# Patient Record
Sex: Female | Born: 1970 | Race: Black or African American | Hispanic: No | Marital: Single | Smoking: Former smoker
Health system: Southern US, Community
[De-identification: ages and names within clinical notes are randomized; demographics above are authoritative.]

## PROBLEM LIST (undated history)

## (undated) DIAGNOSIS — I10 Essential (primary) hypertension: Secondary | ICD-10-CM

## (undated) DIAGNOSIS — J302 Other seasonal allergic rhinitis: Secondary | ICD-10-CM

## (undated) DIAGNOSIS — B9689 Other specified bacterial agents as the cause of diseases classified elsewhere: Secondary | ICD-10-CM

## (undated) DIAGNOSIS — B977 Papillomavirus as the cause of diseases classified elsewhere: Secondary | ICD-10-CM

## (undated) DIAGNOSIS — N760 Acute vaginitis: Secondary | ICD-10-CM

## (undated) DIAGNOSIS — D219 Benign neoplasm of connective and other soft tissue, unspecified: Secondary | ICD-10-CM

## (undated) DIAGNOSIS — J329 Chronic sinusitis, unspecified: Secondary | ICD-10-CM

## (undated) HISTORY — PX: LAPAROSCOPY FOR ECTOPIC PREGNANCY: SUR765

## (undated) HISTORY — PX: DILATION AND CURETTAGE OF UTERUS: SHX78

## (undated) HISTORY — PX: ECTOPIC PREGNANCY SURGERY: SHX613

---

## 2001-07-08 ENCOUNTER — Other Ambulatory Visit: Admission: RE | Admit: 2001-07-08 | Discharge: 2001-07-08 | Payer: Self-pay | Admitting: Internal Medicine

## 2003-03-27 ENCOUNTER — Other Ambulatory Visit: Admission: RE | Admit: 2003-03-27 | Discharge: 2003-03-27 | Payer: Self-pay | Admitting: Family Medicine

## 2003-05-20 ENCOUNTER — Emergency Department (HOSPITAL_COMMUNITY): Admission: EM | Admit: 2003-05-20 | Discharge: 2003-05-20 | Payer: Self-pay | Admitting: Emergency Medicine

## 2005-09-26 ENCOUNTER — Emergency Department (HOSPITAL_COMMUNITY): Admission: EM | Admit: 2005-09-26 | Discharge: 2005-09-26 | Payer: Self-pay | Admitting: Family Medicine

## 2007-04-26 ENCOUNTER — Emergency Department (HOSPITAL_COMMUNITY): Admission: RE | Admit: 2007-04-26 | Discharge: 2007-04-27 | Payer: Self-pay | Admitting: General Practice

## 2009-04-24 ENCOUNTER — Encounter: Admission: RE | Admit: 2009-04-24 | Discharge: 2009-04-24 | Payer: Self-pay | Admitting: Family Medicine

## 2009-06-14 ENCOUNTER — Encounter: Admission: RE | Admit: 2009-06-14 | Discharge: 2009-06-14 | Payer: Self-pay | Admitting: Family Medicine

## 2009-06-26 ENCOUNTER — Encounter: Admission: RE | Admit: 2009-06-26 | Discharge: 2009-06-26 | Payer: Self-pay | Admitting: Family Medicine

## 2009-10-18 ENCOUNTER — Emergency Department (HOSPITAL_COMMUNITY): Admission: EM | Admit: 2009-10-18 | Discharge: 2009-10-18 | Payer: Self-pay | Admitting: Family Medicine

## 2010-05-24 ENCOUNTER — Encounter: Payer: Self-pay | Admitting: Family Medicine

## 2010-05-25 ENCOUNTER — Encounter: Payer: Self-pay | Admitting: Family Medicine

## 2010-07-20 LAB — WET PREP, GENITAL: Yeast Wet Prep HPF POC: NONE SEEN

## 2010-07-20 LAB — GC/CHLAMYDIA PROBE AMP, GENITAL: GC Probe Amp, Genital: NEGATIVE

## 2011-02-06 LAB — BASIC METABOLIC PANEL
Chloride: 107
Creatinine, Ser: 0.81
GFR calc Af Amer: >60
Glucose, Bld: 89
Potassium: 3.6

## 2011-02-06 LAB — DIFFERENTIAL
Basophils Absolute: 0.3 — ABNORMAL HIGH
Eosinophils Absolute: 0.4
Eosinophils Relative: 3
Lymphocytes Relative: 36
Monocytes Absolute: 0.6
Monocytes Relative: 6
Neutrophils Relative %: 52

## 2011-02-06 LAB — URINALYSIS, ROUTINE W REFLEX MICROSCOPIC: Nitrite: NEGATIVE

## 2011-02-06 LAB — URINE CULTURE

## 2011-02-06 LAB — CBC
Hemoglobin: 12.6
Platelets: 448 — ABNORMAL HIGH
RBC: 4.61
WBC: 11.4 — ABNORMAL HIGH

## 2011-02-06 LAB — URINE MICROSCOPIC-ADD ON

## 2011-06-05 ENCOUNTER — Emergency Department (INDEPENDENT_AMBULATORY_CARE_PROVIDER_SITE_OTHER)
Admission: EM | Admit: 2011-06-05 | Discharge: 2011-06-05 | Disposition: A | Discharge status: Home / Self Care | Payer: 59 | Source: Home / Self Care | Attending: Family Medicine | Admitting: Family Medicine

## 2011-06-05 ENCOUNTER — Encounter (HOSPITAL_COMMUNITY): Payer: Self-pay | Admitting: Emergency Medicine

## 2011-06-05 DIAGNOSIS — H109 Unspecified conjunctivitis: Secondary | ICD-10-CM

## 2011-06-05 DIAGNOSIS — N72 Inflammatory disease of cervix uteri: Secondary | ICD-10-CM

## 2011-06-05 DIAGNOSIS — J329 Chronic sinusitis, unspecified: Secondary | ICD-10-CM

## 2011-06-05 LAB — POCT URINALYSIS DIP (DEVICE)
Ketones, ur: NEGATIVE mg/dL
Nitrite: NEGATIVE
Protein, ur: NEGATIVE mg/dL
Specific Gravity, Urine: >=1.03 (ref 1.005–1.030)
Urobilinogen, UA: 1 mg/dL (ref 0.0–1.0)
pH: 5.5 (ref 5.0–8.0)

## 2011-06-05 LAB — WET PREP, GENITAL: Yeast Wet Prep HPF POC: NONE SEEN

## 2011-06-05 MED ORDER — AZITHROMYCIN 250 MG PO TABS
1000.0000 mg | ORAL_TABLET | Freq: Once | ORAL | Status: AC
  Administered 2011-06-05: 1000 mg via ORAL

## 2011-06-05 MED ORDER — CEFTRIAXONE SODIUM 250 MG IJ SOLR
INTRAMUSCULAR | Status: AC
  Filled 2011-06-05: qty 250

## 2011-06-05 MED ORDER — AMOXICILLIN 500 MG PO CAPS: 500.0000 mg | ORAL_CAPSULE | Freq: Three times a day (TID) | ORAL | Status: AC

## 2011-06-05 MED ORDER — METRONIDAZOLE 500 MG PO TABS: 500.0000 mg | ORAL_TABLET | Freq: Two times a day (BID) | ORAL | Status: AC

## 2011-06-05 MED ORDER — AZITHROMYCIN 250 MG PO TABS
1000.0000 mg | ORAL_TABLET | Freq: Every day | ORAL | Status: DC
  Administered 2011-06-05: 1000 mg via ORAL

## 2011-06-05 MED ORDER — PREDNISONE 20 MG PO TABS: ORAL_TABLET | ORAL | Status: AC

## 2011-06-05 MED ORDER — AZITHROMYCIN 250 MG PO TABS
ORAL_TABLET | ORAL | Status: AC
  Filled 2011-06-05: qty 4

## 2011-06-05 MED ORDER — CEFTRIAXONE SODIUM 250 MG IJ SOLR
250.0000 mg | Freq: Once | INTRAMUSCULAR | Status: AC
  Administered 2011-06-05: 250 mg via INTRAMUSCULAR

## 2011-06-05 NOTE — ED Provider Notes (Signed)
History     CSN: [id number]  Arrival date & time 06/05/11  1539   First MD Initiated Contact with Patient 06/05/11 [phone]      Chief Complaint  Patient presents with  . Conjunctivitis  . Vaginal Discharge    (Consider location/radiation/quality/duration/timing/severity/associated sxs/prior treatment) HPI Comments: 41 y/o female [id number] (2 ectopic and 1 miscarriage) also h/o chronic rhinosinusitis,  genital herpes, HPV and uterine fibroids. Here c/o: 1)Vaginal discharge associated with bad oddor and low adbdominal discomfort for about 2 weeks  Removed "an old tampon" that was in her vagina for several days before her period started. She had normal period started about 5 days ago now her period is gone but the vaginal discharge and discomfort persists.  2) Sinus pain pressure and nasal green discharge for 2 weeks associated in last 2 days with subjective fever, chills nausea and headaches.   Patient is a 41 y.o. female presenting with vaginal discharge.  Vaginal Discharge Associated symptoms include headaches. Pertinent negatives include no shortness of breath.    History reviewed. No pertinent past medical history.  Past Surgical History  Procedure Date  . Laparoscopy for ectopic pregnancy     No family history on file.  History  Substance Use Topics  . Smoking status: Never Smoker   . Smokeless tobacco: Not on file  . Alcohol Use: Yes    OB History    Grav Para Term Preterm Abortions TAB SAB Ect Mult Living                  Review of Systems  Constitutional: Positive for fever and chills. Negative for appetite change.  HENT: Positive for congestion, rhinorrhea, postnasal drip and sinus pressure. Negative for sore throat and facial swelling.   Respiratory: Negative for cough and shortness of breath.   Gastrointestinal: Positive for nausea. Negative for vomiting, diarrhea and constipation.  Genitourinary: Positive for vaginal discharge and pelvic pain. Negative for  dysuria, frequency, hematuria and menstrual problem.  Neurological: Positive for headaches. Negative for dizziness.    Allergies  Review of patient's allergies indicates no known allergies.  Home Medications   Current Outpatient Rx  Name Route Sig Dispense Refill  . AMOXICILLIN 500 MG PO CAPS Oral Take 1 capsule (500 mg total) by mouth 3 (three) times daily. 21 capsule 0  . METRONIDAZOLE 500 MG PO TABS Oral Take 1 tablet (500 mg total) by mouth 2 times daily at 12 noon and 4 pm. 14 tablet 0  . PREDNISONE 20 MG PO TABS  2 tabs po daily for 5 days 10 tablet no    BP 164/99  Pulse 64  Temp(Src) 99.2 F (37.3 C) (Oral)  Resp 20  SpO2 97%  LMP 06/01/2011  Physical Exam  Nursing note and vitals reviewed. Constitutional: She appears well-developed and well-nourished. No distress.  HENT:  Head: Normocephalic and atraumatic.  Right Ear: External ear normal.  Left Ear: External ear normal.  Mouth/Throat: Oropharynx is clear and moist.       Nasal Congestion with erythema and swelling of nasal turbinates, yellow rhinorrhea. Post nasal drip mild erythema of the pharyng, no exudates. No uvula deviation. No trismus. TM's normal  Eyes: EOM are normal. Pupils are equal, round, and reactive to light.       Left eye with mild swelling and erythema of the conjunctiva. No significant blepharitis. Mild white exudate.  Neck: Neck supple.  Cardiovascular: Normal heart sounds.   Pulmonary/Chest: Breath sounds normal. No respiratory distress. She has  no wheezes. She has no rales. She exhibits no tenderness.  Abdominal: Soft. She exhibits no distension. There is no tenderness. Hernia confirmed negative in the right inguinal area and confirmed negative in the left inguinal area.       Uterus fundus irregular fibromatous can be felt at level of umbilicus.   Genitourinary: Uterus is enlarged. Uterus is not tender. Cervix exhibits discharge. Cervix exhibits no motion tenderness. Right adnexum displays no  mass, no tenderness and no fullness. Left adnexum displays no mass, no tenderness and no fullness. No erythema, tenderness or bleeding around the vagina. No foreign body around the vagina. Vaginal discharge found.    Lymphadenopathy:    She has no cervical adenopathy.       Right: No inguinal adenopathy present.       Left: No inguinal adenopathy present.    ED Course  Procedures (including critical care time)  Labs Reviewed  POCT URINALYSIS DIP (DEVICE) - Abnormal; Notable for the following:    Hgb urine dipstick MODERATE (*)    All other components within normal limits  WET PREP, GENITAL  GC/CHLAMYDIA PROBE AMP, GENITAL   No results found.   1. Cervicitis   2. Sinusitis   3. Conjunctivitis       MDM  Vaginal discharge with mild pelvic discomfort and cervicitis with no pain with cervical motion, very enlarged fibromatous uterus, negative pregnancy test, treated empirically for gonorrhea and chlamydia with rocephin 250mg  IM x1 and azithromycin 1g po x1. Prescription for amoxicilline and prednisone. Lubricant eye drops and prescription for flagyl to take after finish amoxicillin if persistent vaginal discharge.         Sharin Grave, MD 06/06/11 [phone]

## 2011-06-05 NOTE — ED Notes (Signed)
PT HERE WITH LEFT EYE REDNESS AND WHITE D/C AND MIN BLURRY VISION X 2 DYS AND VAG WHITE D/C WITH ODOR,ABDOMINAL RADIATING TO LOWER BACK,FEVER,CHILLS  THAT STARTED POST MENSTRUAL PERIOD.PT STATES SHE FORGOT OLD TAMPON WAS LEFT IN HER THEN SX STARTED.LMP X 5 DYS AGO

## 2011-06-05 NOTE — ED Notes (Signed)
AZITHROMYCIN 1,000MG  GIVEN PO X 1.CORRECT ORDER PUT IN BY MD FOR ERROR

## 2011-06-08 LAB — POCT PREGNANCY, URINE: Preg Test, Ur: NEGATIVE

## 2011-09-06 ENCOUNTER — Encounter (HOSPITAL_COMMUNITY): Payer: Self-pay | Admitting: Emergency Medicine

## 2011-09-06 ENCOUNTER — Emergency Department (INDEPENDENT_AMBULATORY_CARE_PROVIDER_SITE_OTHER)
Admission: EM | Admit: 2011-09-06 | Discharge: 2011-09-06 | Disposition: A | Discharge status: Home / Self Care | Payer: 59 | Source: Home / Self Care | Attending: Emergency Medicine | Admitting: Emergency Medicine

## 2011-09-06 DIAGNOSIS — J329 Chronic sinusitis, unspecified: Secondary | ICD-10-CM

## 2011-09-06 HISTORY — DX: Papillomavirus as the cause of diseases classified elsewhere: B97.7

## 2011-09-06 HISTORY — DX: Other specified bacterial agents as the cause of diseases classified elsewhere: B96.89

## 2011-09-06 HISTORY — DX: Acute vaginitis: N76.0

## 2011-09-06 HISTORY — DX: Chronic sinusitis, unspecified: J32.9

## 2011-09-06 HISTORY — DX: Benign neoplasm of connective and other soft tissue, unspecified: D21.9

## 2011-09-06 HISTORY — DX: Other seasonal allergic rhinitis: J30.2

## 2011-09-06 MED ORDER — FEXOFENADINE-PSEUDOEPHED ER 60-120 MG PO TB12: 1.0000 | ORAL_TABLET | Freq: Two times a day (BID) | ORAL | Status: DC

## 2011-09-06 MED ORDER — FLUTICASONE PROPIONATE 50 MCG/ACT NA SUSP: 2.0000 | Freq: Every day | NASAL | Status: DC

## 2011-09-06 NOTE — ED Notes (Addendum)
Headache for 3-4 days, face swelling started yesterday.  Seasonal allergies are bothersome.  C/o headache in forehead.  No vision changes

## 2011-09-06 NOTE — ED Provider Notes (Signed)
History     CSN: [id number]  Arrival date & time 09/06/11  1024   First MD Initiated Contact with Patient 09/06/11 1042      Chief Complaint  Patient presents with  . Headache    (Consider location/radiation/quality/duration/timing/severity/associated sxs/prior treatment) HPI Comments: Patient reports constant, gradual onset, dull, throbbing frontal and facial headache with nasal congestion, itchy, watery eyes, yellowish nasal discharge, nonproductive cough over the past several days. Reports postnasal drip. No Aggravating factors. Pain is better with pressing on her frontal sinuses. No fevers, N/V, other HA, bodyaches, dental pain, photophobia, photophobia, neck stiffness, facial rash. Taking Excedrin Migraine, ibuprofen thousand milligrams without relief. She has a history of chronic rhinosinusitis, seasonal allergies, but has not been taking her usual allergy medications. States feels similar to previous episodes of sinus headaches.  ROS as noted in HPI. All other ROS negative.   Patient is a 41 y.o. female presenting with headaches. The history is provided by the patient. No language interpreter was used.  Headache The primary symptoms include headaches.    Past Medical History  Diagnosis Date  . Seasonal allergies   . Asthma   . Ectopic pregnancy     x 2  . Genital herpes   . HPV (human papilloma virus) infection   . Fibroids   . Chronic rhinosinusitis   . BV (bacterial vaginosis)     Past Surgical History  Procedure Date  . Laparoscopy for ectopic pregnancy   . Ectopic pregnancy surgery   . Dilation and curettage of uterus     History reviewed. No pertinent family history.  History  Substance Use Topics  . Smoking status: Former Smoker    Quit date: 08/28/2011  . Smokeless tobacco: Not on file  . Alcohol Use: Yes    OB History    Grav Para Term Preterm Abortions TAB SAB Ect Mult Living                  Review of Systems  Neurological: Positive for  headaches.    Allergies  Review of patient's allergies indicates no known allergies.  Home Medications   Current Outpatient Rx  Name Route Sig Dispense Refill  . IBUPROFEN 800 MG PO TABS Oral Take 800 mg by mouth every 8 (eight) hours as needed.    Marland Kitchen FEXOFENADINE-PSEUDOEPHED ER 60-120 MG PO TB12 Oral Take 1 tablet by mouth every 12 (twelve) hours. 14 tablet 0  . FLUTICASONE PROPIONATE 50 MCG/ACT NA SUSP Nasal Place 2 sprays into the nose daily. 16 g 0    BP 146/88  Pulse 62  Temp(Src) 98.2 F (36.8 C) (Oral)  Resp 17  SpO2 96%  LMP 08/30/2011  Physical Exam  Nursing note and vitals reviewed. Constitutional: She is oriented to person, place, and time. She appears well-developed and well-nourished. No distress.  HENT:  Head: Normocephalic and atraumatic. No trismus in the jaw.  Right Ear: Tympanic membrane normal.  Left Ear: Tympanic membrane normal.  Nose: Mucosal edema present. No rhinorrhea. Right sinus exhibits maxillary sinus tenderness and frontal sinus tenderness. Left sinus exhibits maxillary sinus tenderness and frontal sinus tenderness.  Mouth/Throat: Uvula is midline, oropharynx is clear and moist and mucous membranes are normal. No dental caries.       Mild bilateral periorbital swelling.  Eyes: Conjunctivae and EOM are normal. Pupils are equal, round, and reactive to light.  Neck: Normal range of motion and full passive range of motion without pain. Neck supple. No Brudzinski's sign and no  Kernig's sign noted.  Cardiovascular: Normal rate, regular rhythm and normal heart sounds.   Pulmonary/Chest: Effort normal and breath sounds normal.  Abdominal: Soft. Bowel sounds are normal. She exhibits no distension.  Musculoskeletal: Normal range of motion.  Lymphadenopathy:    She has no cervical adenopathy.  Neurological: She is alert and oriented to person, place, and time. She has normal strength. No cranial nerve deficit or sensory deficit. Coordination and gait normal.   Skin: Skin is warm and dry.  Psychiatric: She has a normal mood and affect. Her behavior is normal. Judgment and thought content normal.    ED Course  Procedures (including critical care time)  Labs Reviewed - No data to display No results found.   1. Sinusitis       MDM  Previous records reviewed Additional medical history obtained. Seen in February 2013, thought to have sinusitis, conjunctivitis, cervicitis.  She was sent home with amoxicillin and prednisone for the sinusitis, which patient states she finished with relief.  No fevers >102, has had sx for < 10 days, no h/o double sickening. No historical or objective evidence of bacterial infection. No indication for abx. Seems to have an allergy component to this, so we'll send her home with Allegra-D. Advised her to keep an eye on her blood pressure while taking this. If it remains persistently elevated, she will stop the Allegra-D, and she is to start Allegra or some other nonsedating antihistamine. Will start flonase, increase fluids, nasal saline irrigation,  tylenol/motrin prn pain. Discussed MDM and plan with pt. Pt agrees with plan and will f/u with PMD of choice prn.       Luiz Blare, MD 09/06/11 1131

## 2011-09-23 ENCOUNTER — Encounter: Payer: Self-pay | Admitting: Family Medicine

## 2011-09-23 ENCOUNTER — Ambulatory Visit (INDEPENDENT_AMBULATORY_CARE_PROVIDER_SITE_OTHER): Payer: 59 | Admitting: Family Medicine

## 2011-09-23 VITALS — BP 138/90 | HR 72 | Ht 66.25 in | Wt 151.0 lb

## 2011-09-23 DIAGNOSIS — Z79899 Other long term (current) drug therapy: Secondary | ICD-10-CM

## 2011-09-23 DIAGNOSIS — J309 Allergic rhinitis, unspecified: Secondary | ICD-10-CM

## 2011-09-23 DIAGNOSIS — R51 Headache: Secondary | ICD-10-CM

## 2011-09-23 DIAGNOSIS — N898 Other specified noninflammatory disorders of vagina: Secondary | ICD-10-CM

## 2011-09-23 DIAGNOSIS — G43909 Migraine, unspecified, not intractable, without status migrainosus: Secondary | ICD-10-CM

## 2011-09-23 DIAGNOSIS — N939 Abnormal uterine and vaginal bleeding, unspecified: Secondary | ICD-10-CM

## 2011-09-23 NOTE — Progress Notes (Signed)
Chief Complaint  Patient presents with  . Migraine    new pt with migraine, onset 4-5 days heart rate slowed feeling weird, tingling left side. shooting pain left eye,  right chest pain, light headed, fatigue, vision changed to where  you couldnt drive. l  . Other Issues    BP issues, bleeding every day (hx of polyps,fibroids), earache, sinus issues   HPI:  She went to UC 5/5 with L sided facial swelling and pain.  Recommended Allegra D and Flonase.  Instead, she took Mucinex-D and nasal saline spray.  Symptoms have improved somewhat.  Postnasal drip was one of her main complaints, and has improved, but not resolved.  Facial swelling resolved.  Headaches at that time were mild, sinus-related.  Current headache began over the weekend, 4 days ago.  Was having gripping sensation at her temples, thought it was a tension headache.  Ibuprofen helped some, using 800 mg (someone else's rx) twice daily, occasionally 3 times.  Pain was manageable until yesterday.  Yesterday got stabbing pain behind L eye, throbbing headache, L eye started watering, runny nose, postnasal drainage/throat-clearing.  Pain was very localized to L cheek, below eye.  Headache worse yesterday; phlegm is mainly clear/white, occasional discolored chunks. Nasal drainage is clear. +PND  She admits that she hasn't paid much attention to herself, as she has been taking care of her mother with stage 4 throat cancer.  She just recently went back to work.  +stress.  Some trouble sleeping.    She was never diagnosed with migraines, but has been self-medicating for migraine/headaches for at least 10 years.  Has sensitivity to smells, light, sounds; +n/v with headaches.  No true aura, just "feels bad" prior to headache starting.  L eye has decreased vision, waters, and hurts with headaches.  Ice helps.  Also gets L earache along with headache.  Previously took [facility] for migraines, recently changed to ibuprofen, which seems to dull it some.  She drinks  4-5 caffeinated beverages daily.  She has cut back drastically (just to 1/day), and having more headaches since cutting back on caffeine. +clenches and grinds teeth  The other concerns listed above are things that she didn't plan to address today, but wanted to mention. Fibroids dx'd  3 years ago.  She has been bleeding daily for about 2.5 months.  Bleeds heavy like a period 2-3/month, spotting the rest of the month. Has been evaluated for vaginal discharge in urgent care, but hasn't had GYN exam in a while.  Recent sexual relationship--used condoms.  Denies possibility of pregnancy.  Last regular period was end of April. Having some vaginal discharge.  Past Medical History  Diagnosis Date  . Seasonal allergies   . Asthma   . Ectopic pregnancy     x 2  . Genital herpes   . HPV (human papilloma virus) infection   . Fibroids   . Chronic rhinosinusitis   . BV (bacterial vaginosis)     Past Surgical History  Procedure Date  . Laparoscopy for ectopic pregnancy   . Ectopic pregnancy surgery   . Dilation and curettage of uterus     History   Social History  . Marital Status: Single    Spouse Name: N/A    Number of Children: 0  . Years of Education: N/A   Occupational History  . customer service [employer]   Social History Main Topics  . Smoking status: Former Smoker    Quit date: 08/28/2011  . Smokeless tobacco:  Never Used  . Alcohol Use: Yes     4-5 beers per week  . Drug Use: No  . Sexually Active: Not Currently -- Female partner(s)     relationship recently ended   Other Topics Concern  . Not on file   Social History Narrative   Single.  Lives alone. No pets    Family History  Problem Relation Age of Onset  . Cancer Mother     throat cancer  . Diabetes Mother   . Hypertension Mother   . Hyperlipidemia Mother   . Heart disease Father   . Hypertension Brother   . Gout Brother   . Cancer Maternal Grandfather     prostate  . Heart disease Paternal  Grandfather     early 11's  . Hypertension Brother     Current Outpatient Prescriptions on File Prior to Visit  Medication Sig Dispense Refill  . ibuprofen (ADVIL,MOTRIN) 800 MG tablet Take 800 mg by mouth every 8 (eight) hours as needed.      Marland Kitchen SALINE NASAL SPRAY NA Place into the nose.      . fluticasone (FLONASE) 50 MCG/ACT nasal spray Place 2 sprays into the nose daily.  16 g  0    Allergies  Allergen Reactions  . Lyrica (Pregabalin)    ROS:  Denies fevers, dizziness, chest pain, palpitations.  Currently has mild headache, no nausea, vomiting diarrhea.  Did vomit yesterday--mainly phlegm.  Denies joint pain. +vaginal discharge, +stress, +poor sleep. Denies weight changes, urinary complaints, or other concerns except as per HPI.  PHYSICAL EXAM: BP 138/90  Pulse 72  Ht 5' 6.25" (1.683 m)  Wt 151 lb (68.493 kg)  BMI 24.19 kg/m2  LMP 08/30/2011 140/84 on RA by MD Well developed, pleasant female, in no distress HEENT:  PERRL, EOMI, conjunctiva clear.  TM's and EAC's normal. OP clear.  Nasal mucosa moderately edematous, no erythema or purulence.  Tender over L maxillary sinus. Neck: no lymphadenopathy, thyromegaly or mass Heart: regular rate and rhythm without murmur Lungs: clear bilaterally Abdomen: soft, nontender, no mass Extremities: no edema Neuro: alert and oriented.  Cranial nerves intact.  DTR's 2+ and symmetric. Normal gait Psych: normal mood, affect, hygiene and grooming.  Became tearing in discussing her mother's illness, stress  ASSESSMENT/PLAN:  1. Sinus headache    2. Allergic rhinitis, cause unspecified    3. Migraine headache    4. Abnormal vaginal bleeding  CBC with Differential, TSH  5. Encounter for long-term (current) use of other medications  Comprehensive metabolic panel   Migraines--consider cluster headache.  Given sinus tenderness, the vomiting of phlegm, and complaints of ongoing PND, There appears to be allergy and sinus component to her  current/recent headache.  Also has component of muscular headaches, possibly contributed by bruxism. Discussed triggers for headaches/migraines, and that the abrupt decrease in caffeine may have contributed, as does stress, poor sleep.  Start Flonase--discussed proper use. Continue Mucinex D as needed Sinus rinses recommended  Gradually cut back on caffeine. Exercise regularly, stress reduction.  Consider employee assistance program through work for counseling. Okay to continue ibuprofen 800mg  three times daily with food--to use at earliest onset of headache, along with caffeine.   Discuss grinding teeth with dentist Headache log/journal recommended  CBC, chem, TSH --for evaluation of abnormal vaginal bleeding (and chem for NSAID use and screening)  Schedule CPE (45 min--f/u on headaches also), follow up sooner if needed

## 2011-09-23 NOTE — Patient Instructions (Signed)
Start Flonase--discussed proper use--2 sprays each nostril daily. Sinus rinses recommended --once or twice daily. Continue Mucinex-D; you might want to add plain Allegra, Claritin or Zyrtec if having allergy symptoms, while waiting for the Flonase to became effective  Gradually cut back on caffeine. Exercise regularly, stress reduction.  Consider employee assistance program through work for counseling. Okay to continue ibuprofen 800mg  three times daily with food--to use at earliest onset of headache, along with caffeine.   Discuss grinding teeth with dentist  Keep headache log--detailed journal of dates, quality of headache, duration, etc. You may return sooner to discuss your headache, if getting worse, rather than waiting for your physical

## 2011-09-24 LAB — CBC WITH DIFFERENTIAL/PLATELET
Basophils Relative: 0 % (ref 0–1)
Hemoglobin: 12.6 g/dL (ref 12.0–15.0)
Lymphocytes Relative: 28 % (ref 12–46)
Lymphs Abs: 2.5 10*3/uL (ref 0.7–4.0)
MCHC: 31.6 g/dL (ref 30.0–36.0)
Monocytes Relative: 5 % (ref 3–12)
Neutro Abs: 5.7 10*3/uL (ref 1.7–7.7)
Neutrophils Relative %: 66 % (ref 43–77)
RBC: 4.8 MIL/uL (ref 3.87–5.11)
WBC: 8.8 10*3/uL (ref 4.0–10.5)

## 2011-09-24 LAB — COMPREHENSIVE METABOLIC PANEL
ALT: 14 U/L (ref 0–35)
Alkaline Phosphatase: 45 U/L (ref 39–117)
CO2: 26 mEq/L (ref 19–32)
Potassium: 4.9 mEq/L (ref 3.5–5.3)
Sodium: 140 mEq/L (ref 135–145)
Total Bilirubin: 0.4 mg/dL (ref 0.3–1.2)
Total Protein: 6.8 g/dL (ref 6.0–8.3)

## 2011-09-24 LAB — TSH: TSH: 0.999 u[IU]/mL (ref 0.350–4.500)

## 2011-09-27 ENCOUNTER — Telehealth (HOSPITAL_COMMUNITY): Payer: Self-pay | Admitting: Emergency Medicine

## 2011-09-27 NOTE — ED Notes (Signed)
Patient called stating she lost her RX for Flonase.  Chart reviewed from 09/06/11 visit at [facility] as well as 09/23/11 visit with Dr Lynelle Doctor.  Patient made aware that do to the length of time and fact she saw her PCP 4 days ago we would not refill Flonase

## 2011-09-29 ENCOUNTER — Telehealth: Payer: Self-pay | Admitting: Family Medicine

## 2011-09-29 DIAGNOSIS — J309 Allergic rhinitis, unspecified: Secondary | ICD-10-CM

## 2011-09-29 MED ORDER — FLUTICASONE PROPIONATE 50 MCG/ACT NA SUSP: 2.0000 | Freq: Every day | NASAL | Status: DC

## 2011-09-29 NOTE — Telephone Encounter (Signed)
Advise pt refill sent to pharmacy.

## 2011-10-05 ENCOUNTER — Encounter: Payer: Self-pay | Admitting: Family Medicine

## 2011-12-07 ENCOUNTER — Ambulatory Visit (INDEPENDENT_AMBULATORY_CARE_PROVIDER_SITE_OTHER): Payer: 59 | Admitting: Family Medicine

## 2011-12-07 ENCOUNTER — Encounter: Payer: Self-pay | Admitting: Family Medicine

## 2011-12-07 VITALS — BP 130/86 | HR 64 | Ht 66.0 in | Wt 159.0 lb

## 2011-12-07 DIAGNOSIS — Z0000 Encounter for general adult medical examination without abnormal findings: Secondary | ICD-10-CM

## 2011-12-07 DIAGNOSIS — E559 Vitamin D deficiency, unspecified: Secondary | ICD-10-CM

## 2011-12-07 DIAGNOSIS — N289 Disorder of kidney and ureter, unspecified: Secondary | ICD-10-CM

## 2011-12-07 DIAGNOSIS — E7800 Pure hypercholesterolemia, unspecified: Secondary | ICD-10-CM

## 2011-12-07 DIAGNOSIS — B373 Candidiasis of vulva and vagina: Secondary | ICD-10-CM

## 2011-12-07 DIAGNOSIS — Z23 Encounter for immunization: Secondary | ICD-10-CM

## 2011-12-07 LAB — BASIC METABOLIC PANEL
BUN: 13 mg/dL (ref 6–23)
CO2: 26 mEq/L (ref 19–32)
Chloride: 105 mEq/L (ref 96–112)
Creat: 0.84 mg/dL (ref 0.50–1.10)

## 2011-12-07 LAB — POCT URINALYSIS DIPSTICK
Ketones, UA: NEGATIVE
Protein, UA: NEGATIVE
Spec Grav, UA: 1.015
pH, UA: 5

## 2011-12-07 LAB — LIPID PANEL: LDL Cholesterol: 133 mg/dL — ABNORMAL HIGH (ref 0–99)

## 2011-12-07 MED ORDER — FLUCONAZOLE 150 MG PO TABS: 150.0000 mg | ORAL_TABLET | Freq: Once | ORAL | Status: AC

## 2011-12-07 NOTE — Patient Instructions (Addendum)
HEALTH MAINTENANCE RECOMMENDATIONS:  It is recommended that you get at least 30 minutes of aerobic exercise at least 5 days/week (for weight loss, you may need as much as 60-90 minutes). This can be any activity that gets your heart rate up. This can be divided in 10-15 minute intervals if needed, but try and build up your endurance at least once a week.  Weight bearing exercise is also recommended twice weekly.  Eat a healthy diet with lots of vegetables, fruits and fiber.  "Colorful" foods have a lot of vitamins (ie green vegetables, tomatoes, red peppers, etc).  Limit sweet tea, regular sodas and alcoholic beverages, all of which has a lot of calories and sugar.  Up to 1 alcoholic drink daily may be beneficial for women (unless trying to lose weight, watch sugars).  Drink a lot of water.  Calcium recommendations are 1200-1500 mg daily (1500 mg for postmenopausal women or women without ovaries), and vitamin D 1000 IU daily.  This should be obtained from diet and/or supplements (vitamins), and calcium should not be taken all at once, but in divided doses.  Monthly self breast exams and yearly mammograms for women over the age of 42 is recommended.  Sunscreen of at least SPF 30 should be used on all sun-exposed parts of the skin when outside between the hours of 10 am and 4 pm (not just when at beach or pool, but even with exercise, golf, tennis, and yard work!)  Use a sunscreen that says "broad spectrum" so it covers both UVA and UVB rays, and make sure to reapply every 1-2 hours.  Remember to change the batteries in your smoke detectors when changing your clock times in the spring and fall.  Use your seat belt every time you are in a car, and please drive safely and not be distracted with cell phones and texting while driving.  Please call to schedule your female wellness exam with gynecologist (Dr. Carrington Clamp at [facility]) or others.  Consider trying lactaid tablets prior to  eating foods containing lactose to help with gas caused by lactose intolerance.
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
## 2011-12-08 ENCOUNTER — Encounter: Payer: Self-pay | Admitting: Family Medicine

## 2011-12-08 LAB — HIV ANTIBODY (ROUTINE TESTING W REFLEX): HIV: NONREACTIVE

## 2012-05-30 ENCOUNTER — Ambulatory Visit (INDEPENDENT_AMBULATORY_CARE_PROVIDER_SITE_OTHER): Payer: 59 | Admitting: Family Medicine

## 2012-05-30 ENCOUNTER — Encounter: Payer: Self-pay | Admitting: Family Medicine

## 2012-05-30 VITALS — BP 120/82 | HR 60 | Ht 66.0 in | Wt 172.0 lb

## 2012-05-30 DIAGNOSIS — L259 Unspecified contact dermatitis, unspecified cause: Secondary | ICD-10-CM

## 2012-05-30 NOTE — Progress Notes (Signed)
Chief Complaint  Patient presents with  . Allergic Reaction    used new facial cleanser for acne-Neutrogena facial wash and astringent containing Salicylic acid 2% Friday 1 time and twice on Sat am and pm. Face began to swell and itch Saturday night. Di dnot use product again face continued to itch and swell.    She used a new facial product a total of three times over a 2 day period, last used 1.5 days ago.  Swelling started after third usage.  The next morning her face was much puffier, red, itchy.  She has been using cool compresses, shea butter. She took a generic benadryl--didn't seem to help. Thought she would be better by now, but isn't, so presents for evaluation today.  Swelling has gone down some, but now face is burning/stinging.  Past Medical History  Diagnosis Date  . Seasonal allergies   . Asthma   . Ectopic pregnancy     x 2  . Genital herpes   . HPV (human papilloma virus) infection   . Fibroids   . Chronic rhinosinusitis   . BV (bacterial vaginosis)    History   Social History  . Marital Status: Single    Spouse Name: N/A    Number of Children: 0  . Years of Education: N/A   Occupational History  . customer service [employer]   Social History Main Topics  . Smoking status: Former Smoker    Quit date: 08/28/2011  . Smokeless tobacco: Never Used  . Alcohol Use: No  . Drug Use: No  . Sexually Active: Not Currently -- Female partner(s)     Comment: relationship recently ended   Other Topics Concern  . Not on file   Social History Narrative   Single.  Lives alone. No pets   Current Outpatient Prescriptions on File Prior to Visit  Medication Sig Dispense Refill  . ibuprofen (ADVIL,MOTRIN) 800 MG tablet Take 800 mg by mouth every 8 (eight) hours as needed.      . IRON PO Take by mouth.       Allergies  Allergen Reactions  . Lyrica (Pregabalin) Other (See Comments)    Neurological issues.   ROS:  Denies fevers, URI symptoms, cough, shortness of  breath, nausea, vomtiing, diarrhea, other skin rashes or concerns.  No headaches, dizziness, chest pain.  Denies mouth/throat swelling.  PHYSICAL EXAM: BP 120/82  Pulse 60  Ht 5\' 6"  (1.676 m)  Wt 172 lb (78.019 kg)  BMI 27.76 kg/m2  LMP 05/11/2012 Pleasant female in no distress Face--some dry skin/flaking and very mild erythema noted at nasolabial folds, and lateral aspect of lips/cheeks.  Some also noted below R eye.  No other rashes/swelling/lesions noted. OP clear without swelling or lesions Heart: regular rate and rhythm without murmur Lungs: clear without wheeze  ASSESSMENT/PLAN: 1. Contact dermatitis    Contact dermatitis, vs intolerance to drying side effect of medication (salicylic acid), and very sensitive skin  Continue cool compresses.  Antihistamines as needed.  Keep skin moisturized; drink plenty of fluids. Consider using 1/2-1% hydrocortisone cream to affected areas twice daily if increased itching/discomfort (but is possible this might cause stinging, so don't use unless worsening).  F/u prn

## 2012-05-30 NOTE — Patient Instructions (Addendum)
  Contact dermatitis, vs intolerance to drying side effect of medication, and very sensitive skin  Continue cool compresses.  Antihistamines as needed.  Keep skin moisturized; drink plenty of fluids. Consider using 1/2-1% hydrocortisone cream to affected areas twice daily if increased itching/discomfort (but is possible this might cause stinging, so don't use unless worsening).  Do not re-try same facial wash, antiseptic

## 2012-08-14 ENCOUNTER — Emergency Department (HOSPITAL_COMMUNITY)
Admission: EM | Admit: 2012-08-14 | Discharge: 2012-08-14 | Disposition: A | Discharge status: Home / Self Care | Payer: 59 | Source: Home / Self Care

## 2012-08-14 ENCOUNTER — Encounter (HOSPITAL_COMMUNITY): Payer: Self-pay | Admitting: *Deleted

## 2012-08-14 DIAGNOSIS — J069 Acute upper respiratory infection, unspecified: Secondary | ICD-10-CM

## 2012-08-14 NOTE — ED Notes (Signed)
Pt reports URI symptoms on and off for 3 weeks but much worse since Thursday. Chest congestion, fever, nasal drainage

## 2012-08-14 NOTE — ED Provider Notes (Signed)
Medical screening examination/treatment/procedure(s) were performed by non-physician practitioner and as supervising physician I was immediately available for consultation/collaboration.  Raynald Blend, MD 08/14/12 1534

## 2012-08-14 NOTE — ED Provider Notes (Signed)
Dawn Wallace is a 42 y.o. female who presents to Urgent Care today for cough, congestion, fever. 3 days prior to presentation patient had a significant cough and congestion associated with a fever of 104. However with over-the-counter pain medications and time her fever has resolved. She says that she is about 60% improved. She notes some congestion and cough but overall is feeling well. No shortness of breath or chest pains palpitations current fever or chills.    PMH reviewed. 3 hypertension History  Substance Use Topics  . Smoking status: Former Smoker    Quit date: 08/28/2011  . Smokeless tobacco: Never Used  . Alcohol Use: No   ROS as above Medications reviewed. No current facility-administered medications for this encounter.   Current Outpatient Prescriptions  Medication Sig Dispense Refill  . ibuprofen (ADVIL,MOTRIN) 800 MG tablet Take 800 mg by mouth every 8 (eight) hours as needed.      . IRON PO Take by mouth.      . Multiple Vitamins-Minerals (ALIVE WOMENS ENERGY PO) Take 1 tablet by mouth daily.        Exam:  BP 136/81  Pulse 73  Temp(Src) 98.3 F (36.8 C) (Oral)  Resp 18  SpO2 99% Gen: Well NAD HEENT: EOMI,  MMM, normal tympanic membranes bilaterally. Normal posterior pharynx. Poor dentition Lungs: CTABL Nl WOB Heart: RRR no MRG Abd: NABS, NT, ND Exts: Non edematous BL  LE, warm and well perfused.   No results found for this or any previous visit (from the past 24 hour(s)). No results found.  Assessment and Plan: 42 y.o. female with resolving viral URI.  Symptomatic management as needed Followup with primary care Dr. as needed Discussed warning signs or symptoms. Please see discharge instructions. Patient expresses understanding.      Rodolph Bong, MD 08/14/12 [phone]

## 2014-03-05 ENCOUNTER — Encounter (HOSPITAL_COMMUNITY): Payer: Self-pay | Admitting: *Deleted

## 2014-12-25 ENCOUNTER — Emergency Department (HOSPITAL_COMMUNITY): Payer: BLUE CROSS/BLUE SHIELD

## 2014-12-25 ENCOUNTER — Encounter (HOSPITAL_COMMUNITY): Payer: Self-pay | Admitting: *Deleted

## 2014-12-25 ENCOUNTER — Emergency Department (HOSPITAL_COMMUNITY)
Admission: EM | Admit: 2014-12-25 | Discharge: 2014-12-25 | Disposition: A | Discharge status: Home / Self Care | Payer: BLUE CROSS/BLUE SHIELD | Attending: Emergency Medicine | Admitting: Emergency Medicine

## 2014-12-25 DIAGNOSIS — Z87891 Personal history of nicotine dependence: Secondary | ICD-10-CM | POA: Insufficient documentation

## 2014-12-25 DIAGNOSIS — Z86018 Personal history of other benign neoplasm: Secondary | ICD-10-CM | POA: Diagnosis not present

## 2014-12-25 DIAGNOSIS — Z79899 Other long term (current) drug therapy: Secondary | ICD-10-CM | POA: Diagnosis not present

## 2014-12-25 DIAGNOSIS — J45901 Unspecified asthma with (acute) exacerbation: Secondary | ICD-10-CM | POA: Diagnosis not present

## 2014-12-25 DIAGNOSIS — Z8619 Personal history of other infectious and parasitic diseases: Secondary | ICD-10-CM | POA: Insufficient documentation

## 2014-12-25 DIAGNOSIS — R05 Cough: Secondary | ICD-10-CM

## 2014-12-25 DIAGNOSIS — Z8742 Personal history of other diseases of the female genital tract: Secondary | ICD-10-CM | POA: Insufficient documentation

## 2014-12-25 DIAGNOSIS — R059 Cough, unspecified: Secondary | ICD-10-CM

## 2014-12-25 MED ORDER — DEXTROMETHORPHAN POLISTIREX ER 30 MG/5ML PO SUER: 30.0000 mg | Freq: Two times a day (BID) | ORAL | Status: AC

## 2014-12-25 NOTE — ED Provider Notes (Signed)
CSN: [id number]     Arrival date & time 12/25/14  1049 History  This chart was scribed for non-physician practitioner, Junius Creamer, NP, working with Merrily Pew, MD, by Stephania Fragmin, ED Scribe. This patient was seen in room TR02C/TR02C and the patient's care was started at 1:22 PM.    Chief Complaint  Patient presents with  . Shortness of Breath  . Cough   The history is provided by the patient. No language interpreter was used.    HPI Comments: Dawn Wallace is a 44 y.o. female who presents to the Emergency Department complaining of a constant, moderate, productive cough with clear-pink sputum ongoing for the past 2 days. Patient recently had a sinus infection that was treated with antibiotics, and she states she originally had green-yellow nasal congestion that lightened up to clear-pink after finishing antibiotics. She states her cough started at the end of the sinus infection. Patient has tried Liquid Mucinex with only some relief.    Past Medical History  Diagnosis Date  . Seasonal allergies   . Asthma   . Ectopic pregnancy     x 2  . Genital herpes   . HPV (human papilloma virus) infection   . Fibroids   . Chronic rhinosinusitis   . BV (bacterial vaginosis)    Past Surgical History  Procedure Laterality Date  . Laparoscopy for ectopic pregnancy    . Ectopic pregnancy surgery    . Dilation and curettage of uterus     Family History  Problem Relation Age of Onset  . Cancer Mother     throat cancer  . Diabetes Mother   . Hypertension Mother   . Hyperlipidemia Mother   . Heart disease Father   . Hypertension Brother   . Gout Brother   . Cancer Maternal Grandfather     prostate  . Heart disease Paternal Grandfather     early 37's  . Hypertension Brother   . COPD Maternal Grandmother    Social History  Substance Use Topics  . Smoking status: Former Smoker    Quit date: 08/28/2011  . Smokeless tobacco: Never Used  . Alcohol Use: No   OB History    Gravida Para  Term Preterm AB TAB SAB Ectopic Multiple Living   4    3 1 2          Obstetric Comments   2 ectopic pregnancies, 1 miscarriage     Review of Systems  HENT: Positive for congestion. Negative for postnasal drip, rhinorrhea and sore throat.   Respiratory: Positive for cough. Negative for shortness of breath.   All other systems reviewed and are negative.     Allergies  Lyrica  Home Medications   Prior to Admission medications   Medication Sig Start Date End Date Taking? Authorizing Provider  dextromethorphan (DELSYM) 30 MG/5ML liquid Take 5 mLs (30 mg total) by mouth 2 (two) times daily. 12/25/14   Junius Creamer, NP  ibuprofen (ADVIL,MOTRIN) 800 MG tablet Take 800 mg by mouth every 8 (eight) hours as needed.    Historical Provider, MD  IRON PO Take by mouth.    Historical Provider, MD  Multiple Vitamins-Minerals (ALIVE WOMENS ENERGY PO) Take 1 tablet by mouth daily.    Historical Provider, MD   BP 151/88 mmHg  Pulse 75  Temp(Src) 98 F (36.7 C) (Oral)  Resp 18  Ht 5\' 6"  (1.676 m)  Wt 189 lb (85.73 kg)  BMI 30.52 kg/m2  SpO2 100% Physical Exam  Constitutional: She  is oriented to person, place, and time. She appears well-developed and well-nourished. No distress.  HENT:  Head: Normocephalic and atraumatic.  Mouth/Throat: Oropharynx is clear and moist.  Eyes: Pupils are equal, round, and reactive to light.  Neck: Normal range of motion.  Cardiovascular: Normal rate and regular rhythm.   Pulmonary/Chest: Effort normal and breath sounds normal. She has no wheezes.  Musculoskeletal: Normal range of motion.  Neurological: She is alert and oriented to person, place, and time.  Skin: Skin is warm and dry. No rash noted.  Psychiatric: She has a normal mood and affect. Her behavior is normal.  Nursing note and vitals reviewed.   ED Course  Procedures (including critical care time)  DIAGNOSTIC STUDIES: Oxygen Saturation is 100% on RA, normal by my interpretation.     COORDINATION OF CARE: 1:25 PM - Discussed treatment plan with pt at bedside which includes cough medications. Pt verbalized understanding and agreed to plan.   Imaging Review Dg Chest 2 View  12/25/2014   CLINICAL DATA:  Shortness of breath, cough and congestion for 2 days. Initial encounter.  EXAM: CHEST  2 VIEW  COMPARISON:  None.  FINDINGS: Heart size and mediastinal contours are within normal limits. Both lungs are clear. Visualized skeletal structures are unremarkable.  IMPRESSION: Normal exam.   Electronically Signed   By: Inge Rise M.D.   On: 12/25/2014 11:44   I have personally reviewed and evaluated these images and lab results as part of my medical decision-making. Have reviewed the patient's chest x-ray, which is normal.  She does not appear to have any nasal congestion or postnasal drip.  She has a tickle type cough.  I will prescribe Delsym and have her follow-up with her primary care physician. She is in no respiratory distress.  She is speaking in full sentences, no wheezing or rhonchi on examination, vital signs show that she is satting 100% on room air MDM   Final diagnoses:  Cough        Junius Creamer, NP 12/25/14 1334  Merrily Pew, MD 12/28/14 [phone]

## 2014-12-25 NOTE — Discharge Instructions (Signed)
Cough, Adult  A cough is a reflex. It helps you clear your throat and airways. A cough can help heal your body. A cough can last 2 or 3 weeks (acute) or may last more than 8 weeks (chronic). Some common causes of a cough can include an infection, allergy, or a cold. HOME CARE  Only take medicine as told by your doctor.  If given, take your medicines (antibiotics) as told. Finish them even if you start to feel better.  Use a cold steam vaporizer or humidifier in your home. This can help loosen thick spit (secretions).  Sleep so you are almost sitting up (semi-upright). Use pillows to do this. This helps reduce coughing.  Rest as needed.  Stop smoking if you smoke. GET HELP RIGHT AWAY IF:  You have yellowish-white fluid (pus) in your thick spit.  Your cough gets worse.  Your medicine does not reduce coughing, and you are losing sleep.  You cough up blood.  You have trouble breathing.  Your pain gets worse and medicine does not help.  You have a fever. MAKE SURE YOU:   Understand these instructions.  Will watch your condition.  Will get help right away if you are not doing well or get worse. Document Released: 01/01/2011 Document Revised: 09/04/2013 Document Reviewed: 01/01/2011 [facility] Patient Information 2015 [place], [place]. This information is not intended to replace advice given to you by your health care provider. Make sure you discuss any questions you have with your health care provider. Your chest X Ray is normal

## 2014-12-25 NOTE — ED Notes (Signed)
BP reported to [facility] [place], [place]

## 2014-12-25 NOTE — ED Notes (Signed)
Pt reports SOB with cough and congestion for 2 days ago. Pt states that she has had "sinus trouble" for 1 month. NADF noted. Pt states that she feels like her "lungs are filling up" pt reports productive clear cough.

## 2016-01-24 ENCOUNTER — Encounter: Payer: Self-pay | Admitting: Gastroenterology

## 2016-04-01 ENCOUNTER — Ambulatory Visit: Payer: BLUE CROSS/BLUE SHIELD | Admitting: Gastroenterology

## 2017-12-27 ENCOUNTER — Emergency Department (HOSPITAL_COMMUNITY): Payer: BLUE CROSS/BLUE SHIELD

## 2017-12-27 ENCOUNTER — Encounter (HOSPITAL_COMMUNITY): Payer: Self-pay

## 2017-12-27 ENCOUNTER — Emergency Department (HOSPITAL_COMMUNITY)
Admission: EM | Admit: 2017-12-27 | Discharge: 2017-12-27 | Disposition: A | Discharge status: Home / Self Care | Payer: BLUE CROSS/BLUE SHIELD | Attending: Emergency Medicine | Admitting: Emergency Medicine

## 2017-12-27 DIAGNOSIS — M25562 Pain in left knee: Secondary | ICD-10-CM | POA: Insufficient documentation

## 2017-12-27 DIAGNOSIS — I10 Essential (primary) hypertension: Secondary | ICD-10-CM | POA: Diagnosis not present

## 2017-12-27 DIAGNOSIS — R51 Headache: Secondary | ICD-10-CM | POA: Insufficient documentation

## 2017-12-27 DIAGNOSIS — Y999 Unspecified external cause status: Secondary | ICD-10-CM | POA: Diagnosis not present

## 2017-12-27 DIAGNOSIS — S99922A Unspecified injury of left foot, initial encounter: Secondary | ICD-10-CM

## 2017-12-27 DIAGNOSIS — M25531 Pain in right wrist: Secondary | ICD-10-CM | POA: Diagnosis not present

## 2017-12-27 DIAGNOSIS — R079 Chest pain, unspecified: Secondary | ICD-10-CM | POA: Insufficient documentation

## 2017-12-27 DIAGNOSIS — Y929 Unspecified place or not applicable: Secondary | ICD-10-CM | POA: Diagnosis not present

## 2017-12-27 DIAGNOSIS — M79632 Pain in left forearm: Secondary | ICD-10-CM | POA: Insufficient documentation

## 2017-12-27 DIAGNOSIS — M79672 Pain in left foot: Secondary | ICD-10-CM | POA: Insufficient documentation

## 2017-12-27 DIAGNOSIS — Y939 Activity, unspecified: Secondary | ICD-10-CM | POA: Insufficient documentation

## 2017-12-27 DIAGNOSIS — Z79899 Other long term (current) drug therapy: Secondary | ICD-10-CM | POA: Insufficient documentation

## 2017-12-27 DIAGNOSIS — J45909 Unspecified asthma, uncomplicated: Secondary | ICD-10-CM | POA: Diagnosis not present

## 2017-12-27 DIAGNOSIS — F17200 Nicotine dependence, unspecified, uncomplicated: Secondary | ICD-10-CM | POA: Diagnosis not present

## 2017-12-27 DIAGNOSIS — R52 Pain, unspecified: Secondary | ICD-10-CM

## 2017-12-27 HISTORY — DX: Essential (primary) hypertension: I10

## 2017-12-27 LAB — I-STAT CHEM 8, ED
BUN: 6 mg/dL (ref 6–20)
CHLORIDE: 105 mmol/L (ref 98–111)
Calcium, Ion: 1.05 mmol/L — ABNORMAL LOW (ref 1.15–1.40)
Creatinine, Ser: 0.8 mg/dL (ref 0.44–1.00)
Glucose, Bld: 94 mg/dL (ref 70–99)
HEMATOCRIT: 44 % (ref 36.0–46.0)
Hemoglobin: 15 g/dL (ref 12.0–15.0)
POTASSIUM: 3.1 mmol/L — AB (ref 3.5–5.1)
Sodium: 140 mmol/L (ref 135–145)
TCO2: 26 mmol/L (ref 22–32)

## 2017-12-27 LAB — CBC
HCT: 41 % (ref 36.0–46.0)
HEMOGLOBIN: 14 g/dL (ref 12.0–15.0)
MCH: 28.6 pg (ref 26.0–34.0)
MCHC: 34.1 g/dL (ref 30.0–36.0)
MCV: 83.7 fL (ref 78.0–100.0)
Platelets: 237 10*3/uL (ref 150–400)
RBC: 4.9 MIL/uL (ref 3.87–5.11)
RDW: 15.6 % — ABNORMAL HIGH (ref 11.5–15.5)
WBC: 14.6 10*3/uL — ABNORMAL HIGH (ref 4.0–10.5)

## 2017-12-27 MED ORDER — AMLODIPINE BESYLATE 5 MG PO TABS: 5.0000 mg | ORAL_TABLET | Freq: Every day | ORAL | 0 refills | Status: AC

## 2017-12-27 MED ORDER — AMLODIPINE BESYLATE 5 MG PO TABS
5.0000 mg | ORAL_TABLET | Freq: Once | ORAL | Status: AC
  Administered 2017-12-27: 5 mg via ORAL
  Filled 2017-12-27: qty 1

## 2017-12-27 NOTE — Discharge Instructions (Signed)
Follow-up with a primary care doctor to check on your blood pressure, follow-up with an orthopedic doctor regarding your foot injury.  X-ray showed the possibility of
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
## 2017-12-27 NOTE — ED Notes (Signed)
Pt retgurns from xray.

## 2017-12-27 NOTE — ED Notes (Signed)
Patient transported to X-ray 
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
## 2018-05-04 ENCOUNTER — Emergency Department (HOSPITAL_COMMUNITY): Payer: BLUE CROSS/BLUE SHIELD

## 2018-05-04 ENCOUNTER — Emergency Department (HOSPITAL_COMMUNITY)
Admission: EM | Admit: 2018-05-04 | Discharge: 2018-05-04 | Disposition: A | Discharge status: Home / Self Care | Payer: BLUE CROSS/BLUE SHIELD | Attending: Emergency Medicine | Admitting: Emergency Medicine

## 2018-05-04 DIAGNOSIS — S5002XA Contusion of left elbow, initial encounter: Secondary | ICD-10-CM | POA: Insufficient documentation

## 2018-05-04 DIAGNOSIS — W109XXA Fall (on) (from) unspecified stairs and steps, initial encounter: Secondary | ICD-10-CM | POA: Diagnosis not present

## 2018-05-04 DIAGNOSIS — J45909 Unspecified asthma, uncomplicated: Secondary | ICD-10-CM | POA: Insufficient documentation

## 2018-05-04 DIAGNOSIS — Z79899 Other long term (current) drug therapy: Secondary | ICD-10-CM | POA: Insufficient documentation

## 2018-05-04 DIAGNOSIS — S59902A Unspecified injury of left elbow, initial encounter: Secondary | ICD-10-CM | POA: Diagnosis present

## 2018-05-04 DIAGNOSIS — I10 Essential (primary) hypertension: Secondary | ICD-10-CM | POA: Insufficient documentation

## 2018-05-04 DIAGNOSIS — Y999 Unspecified external cause status: Secondary | ICD-10-CM | POA: Insufficient documentation

## 2018-05-04 DIAGNOSIS — Y939 Activity, unspecified: Secondary | ICD-10-CM | POA: Insufficient documentation

## 2018-05-04 DIAGNOSIS — Z87891 Personal history of nicotine dependence: Secondary | ICD-10-CM | POA: Diagnosis not present

## 2018-05-04 DIAGNOSIS — S81811A Laceration without foreign body, right lower leg, initial encounter: Secondary | ICD-10-CM | POA: Insufficient documentation

## 2018-05-04 DIAGNOSIS — L089 Local infection of the skin and subcutaneous tissue, unspecified: Secondary | ICD-10-CM | POA: Diagnosis not present

## 2018-05-04 DIAGNOSIS — Z23 Encounter for immunization: Secondary | ICD-10-CM | POA: Diagnosis not present

## 2018-05-04 DIAGNOSIS — T148XXA Other injury of unspecified body region, initial encounter: Secondary | ICD-10-CM

## 2018-05-04 DIAGNOSIS — S8012XA Contusion of left lower leg, initial encounter: Secondary | ICD-10-CM | POA: Insufficient documentation

## 2018-05-04 DIAGNOSIS — M25532 Pain in left wrist: Secondary | ICD-10-CM | POA: Diagnosis not present

## 2018-05-04 DIAGNOSIS — W19XXXA Unspecified fall, initial encounter: Secondary | ICD-10-CM

## 2018-05-04 DIAGNOSIS — Y9289 Other specified places as the place of occurrence of the external cause: Secondary | ICD-10-CM | POA: Insufficient documentation

## 2018-05-04 LAB — CBG MONITORING, ED: Glucose-Capillary: 96 mg/dL (ref 70–99)

## 2018-05-04 MED ORDER — CEPHALEXIN 500 MG PO CAPS: 500.0000 mg | ORAL_CAPSULE | Freq: Four times a day (QID) | ORAL | 0 refills | Status: AC

## 2018-05-04 MED ORDER — TETANUS-DIPHTH-ACELL PERTUSSIS 5-2.5-18.5 LF-MCG/0.5 IM SUSP
0.5000 mL | Freq: Once | INTRAMUSCULAR | Status: AC
  Administered 2018-05-04: 0.5 mL via INTRAMUSCULAR
  Filled 2018-05-04: qty 0.5

## 2018-05-04 MED ORDER — SULFAMETHOXAZOLE-TRIMETHOPRIM 800-160 MG PO TABS: 1.0000 | ORAL_TABLET | Freq: Two times a day (BID) | ORAL | 0 refills | Status: AC

## 2018-05-04 NOTE — Discharge Instructions (Addendum)
Please read the attached information's.  Please follow-up with primary care for reevaluation if symptoms persist beyond treatment duration.  Please return to the emergency room if symptoms worsen, please return if symptoms do not improve within 48 hours.  Please follow-up with outpatient orthopedic specialist for evaluation of your elbow.

## 2018-05-04 NOTE — ED Notes (Signed)
Patient verbalizes understanding of discharge instructions. Opportunity for questioning and answers were provided. Armband removed by staff, pt discharged from ED ambulatory.   

## 2018-05-04 NOTE — ED Triage Notes (Signed)
Pt reports while walking down some steps yesterday, she tripped. She caught herself before falling but heard a pop in her R arm. Reports pain and swelling around elbow and upper forearm.

## 2018-05-04 NOTE — ED Provider Notes (Signed)
[facility] EMERGENCY DEPARTMENT Provider Note   CSN: [id number] Arrival date & time: 05/04/18  1341     History   Chief Complaint Chief Complaint  Patient presents with  . Arm Pain    HPI Dawn Wallace is a 48 y.o. female.  HPI   48 year old female status post fall.  Patient notes approximately 2 weeks ago she fell down a flight of stairs causing pain to her right elbow.  She notes some minor swelling at that time, she notes that she has been unable to completely extend the elbow since the injury.  She notes she felt a pop in the elbow yesterday when trying to extend the elbow.  She notes some bruising around the lateral aspect.  She denies any loss of distal sensation strength or motor function at the wrist or hands.  Patient notes that during the fall she also suffered a laceration to her right shin that has had some clear drainage from it, small bruising noted to the left shin.  Patient also notes pain at her left wrist generally with no significant decreased range of motion.    Past Medical History:  Diagnosis Date  . Asthma   . BV (bacterial vaginosis)   . Chronic rhinosinusitis   . Ectopic pregnancy    x 2  . Fibroids   . Genital herpes   . HPV (human papilloma virus) infection   . Hypertension   . Seasonal allergies     There are no active problems to display for this patient.   Past Surgical History:  Procedure Laterality Date  . DILATION AND CURETTAGE OF UTERUS    . ECTOPIC PREGNANCY SURGERY    . LAPAROSCOPY FOR ECTOPIC PREGNANCY       OB History    Gravida  4   Para      Term      Preterm      AB  3   Living        SAB  2   TAB  1   Ectopic      Multiple      Live Births           Obstetric Comments  2 ectopic pregnancies, 1 miscarriage         Home Medications    Prior to Admission medications   Medication Sig Start Date End Date Taking? Authorizing Provider  amLODipine (NORVASC) 5 MG tablet Take 1  tablet (5 mg total) by mouth daily. 12/27/17   Dorie Rank, MD  cephALEXin (KEFLEX) 500 MG capsule Take 1 capsule (500 mg total) by mouth 4 (four) times daily. 05/04/18   Arvel Oquinn, Dellis Filbert, PA-C  dextromethorphan (DELSYM) 30 MG/5ML liquid Take 5 mLs (30 mg total) by mouth 2 (two) times daily. 12/25/14   Junius Creamer, NP  ibuprofen (ADVIL,MOTRIN) 800 MG tablet Take 800 mg by mouth every 8 (eight) hours as needed.    [provider]  IRON PO Take by mouth.    [provider]  Multiple Vitamins-Minerals (ALIVE WOMENS ENERGY PO) Take 1 tablet by mouth daily.    [provider]  sulfamethoxazole-trimethoprim (BACTRIM DS,SEPTRA DS) 800-160 MG tablet Take 1 tablet by mouth 2 (two) times daily for 7 days. 05/04/18 05/11/18  Okey Regal, PA-C    Family History Family History  Problem Relation Age of Onset  . Cancer Mother        throat cancer  . Diabetes Mother   . Hypertension Mother   .  Hyperlipidemia Mother   . Heart disease Father   . Hypertension Brother   . Gout Brother   . Heart disease Paternal Grandfather        early 58's  . Hypertension Brother   . Cancer Maternal Grandfather        prostate  . COPD Maternal Grandmother     Social History Social History   Tobacco Use  . Smoking status: Former Smoker    Last attempt to quit: 08/28/2011    Years since quitting: 6.6  . Smokeless tobacco: Never Used  Substance Use Topics  . Alcohol use: No  . Drug use: No     Allergies   Lyrica [pregabalin]   Review of Systems Review of Systems  All other systems reviewed and are negative.  Physical Exam Updated Vital Signs BP (!) 163/108 (BP Location: Right Arm)   Pulse 86   Temp 98.6 F (37 C) (Oral)   Resp 16   Ht 5\' 6"  (1.676 m)   Wt 83.9 kg   SpO2 99%   BMI 29.86 kg/m   Physical Exam Vitals signs and nursing note reviewed.  Constitutional:      Appearance: She is well-developed.  HENT:     Head: Normocephalic and atraumatic.  Eyes:      General: No scleral icterus.       Right eye: No discharge.        Left eye: No discharge.     Conjunctiva/sclera: Conjunctivae normal.     Pupils: Pupils are equal, round, and reactive to light.  Neck:     Musculoskeletal: Normal range of motion.     Vascular: No JVD.     Trachea: No tracheal deviation.  Pulmonary:     Effort: Pulmonary effort is normal.     Breath sounds: No stridor.  Musculoskeletal:     Comments: Right elbow with minor bruising, near complete extension noted but with pain, pain with internal and external rotation along the medial aspect of the elbow, no warmth to touch no open wounds  2 cm laceration over the anterior mid tibial region of the right lower extremity with serous drainage and approximately 2 cm of surrounding erythema-no purulent drainage  Bruising and superficial abrasions noted to the left shin  Left wrist with generalized tender to palpation, no significant point tenderness  Neurological:     Mental Status: She is alert and oriented to person, place, and time.     Coordination: Coordination normal.  Psychiatric:        Behavior: Behavior normal.        Thought Content: Thought content normal.        Judgment: Judgment normal.      ED Treatments / Results  Labs (all labs ordered are listed, but only abnormal results are displayed) Labs Reviewed  CBG MONITORING, ED    EKG None  Radiology Dg Elbow Complete Right  Result Date: 05/04/2018 CLINICAL DATA:  Status post fall with right elbow pain. EXAM: RIGHT ELBOW - COMPLETE 3+ VIEW COMPARISON:  None. FINDINGS: There is no evidence of fracture, dislocation, or joint effusion. There is no evidence of arthropathy or other focal bone abnormality. Soft tissues are unremarkable. IMPRESSION: Negative. Electronically Signed   By: Abelardo Diesel M.D.   On: 05/04/2018 15:28   Dg Wrist Complete Left  Result Date: 05/04/2018 CLINICAL DATA:  Status post fall with left wrist pain. EXAM: LEFT WRIST -  COMPLETE 3+ VIEW COMPARISON:  None. FINDINGS: There is no  evidence of fracture or dislocation. There is probably chronic old posttraumatic change of the scaphoid. Soft tissues are unremarkable. IMPRESSION: No acute abnormality identified. Electronically Signed   By: Abelardo Diesel M.D.   On: 05/04/2018 15:30   Dg Tibia/fibula Right  Result Date: 05/04/2018 CLINICAL DATA:  Status post fall with right leg pain. EXAM: RIGHT TIBIA AND FIBULA - 2 VIEW COMPARISON:  None. FINDINGS: There is no evidence of fracture or dislocation. Mild soft tissue swelling around the ankle is noted. IMPRESSION: No acute fracture or dislocation. Electronically Signed   By: Abelardo Diesel M.D.   On: 05/04/2018 15:31    Procedures Procedures (including critical care time)  Medications Ordered in ED Medications  Tdap (BOOSTRIX) injection 0.5 mL (0.5 mLs Intramuscular Given 05/04/18 1551)     Initial Impression / Assessment and Plan / ED Course  I have reviewed the triage vital signs and the nursing notes.  Pertinent labs & imaging results that were available during my care of the patient were reviewed by me and considered in my medical decision making (see chart for details).     Labs:   Imaging: DG wrist complete left, DG elbow complete right, DG tib-fib right  Consults:  Therapeutics: Tdap  Discharge Meds: Bactrim, Keflex  Assessment/Plan: 48 year old female presents today with several complaints.  Patient has a laceration with surrounding redness, no purulent drainage or drainable abscess.  Cellulitis will be treated the Bactrim and Keflex, she will return with any new or worsening signs or symptoms or in 48 hours if symptoms not improving.  Patient also has an injury to her elbow, she has near full extension I have low suspicion for occult fracture.  She will follow-up as an outpatient with orthopedic specialist for reevaluation, she is given strict return precautions, she verbalized understanding and agreement  to today's plan had no further questions or concerns.   Final Clinical Impressions(s) / ED Diagnoses   Final diagnoses:  Injury of left elbow, initial encounter  Infected laceration of skin    ED Discharge Orders         Ordered    sulfamethoxazole-trimethoprim (BACTRIM DS,SEPTRA DS) 800-160 MG tablet  2 times daily     05/04/18 1606    cephALEXin (KEFLEX) 500 MG capsule  4 times daily     05/04/18 1606           Okey Regal, PA-C 05/04/18 1610    Malvin Johns, MD 05/05/18 2327

## 2020-10-08 IMAGING — CR DG TIBIA/FIBULA 2V*R*
4 series · 4 of 4 positions shown · non-contrast
Comparison: None.

CLINICAL DATA: Status post fall with right leg pain.

EXAM:
RIGHT TIBIA AND FIBULA - 2 VIEW

[tibia ap (1 of 2)]
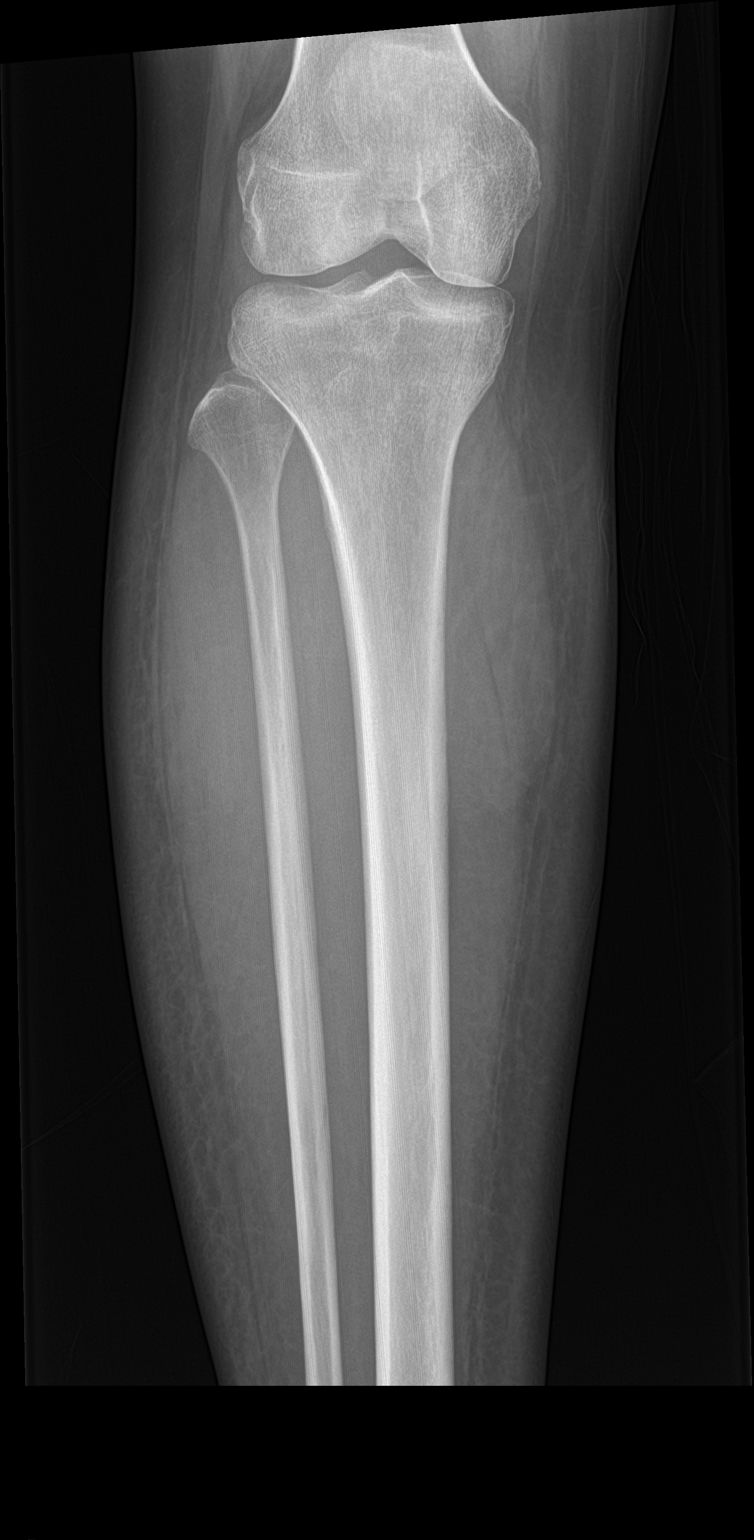

[tibia ap (2 of 2)]
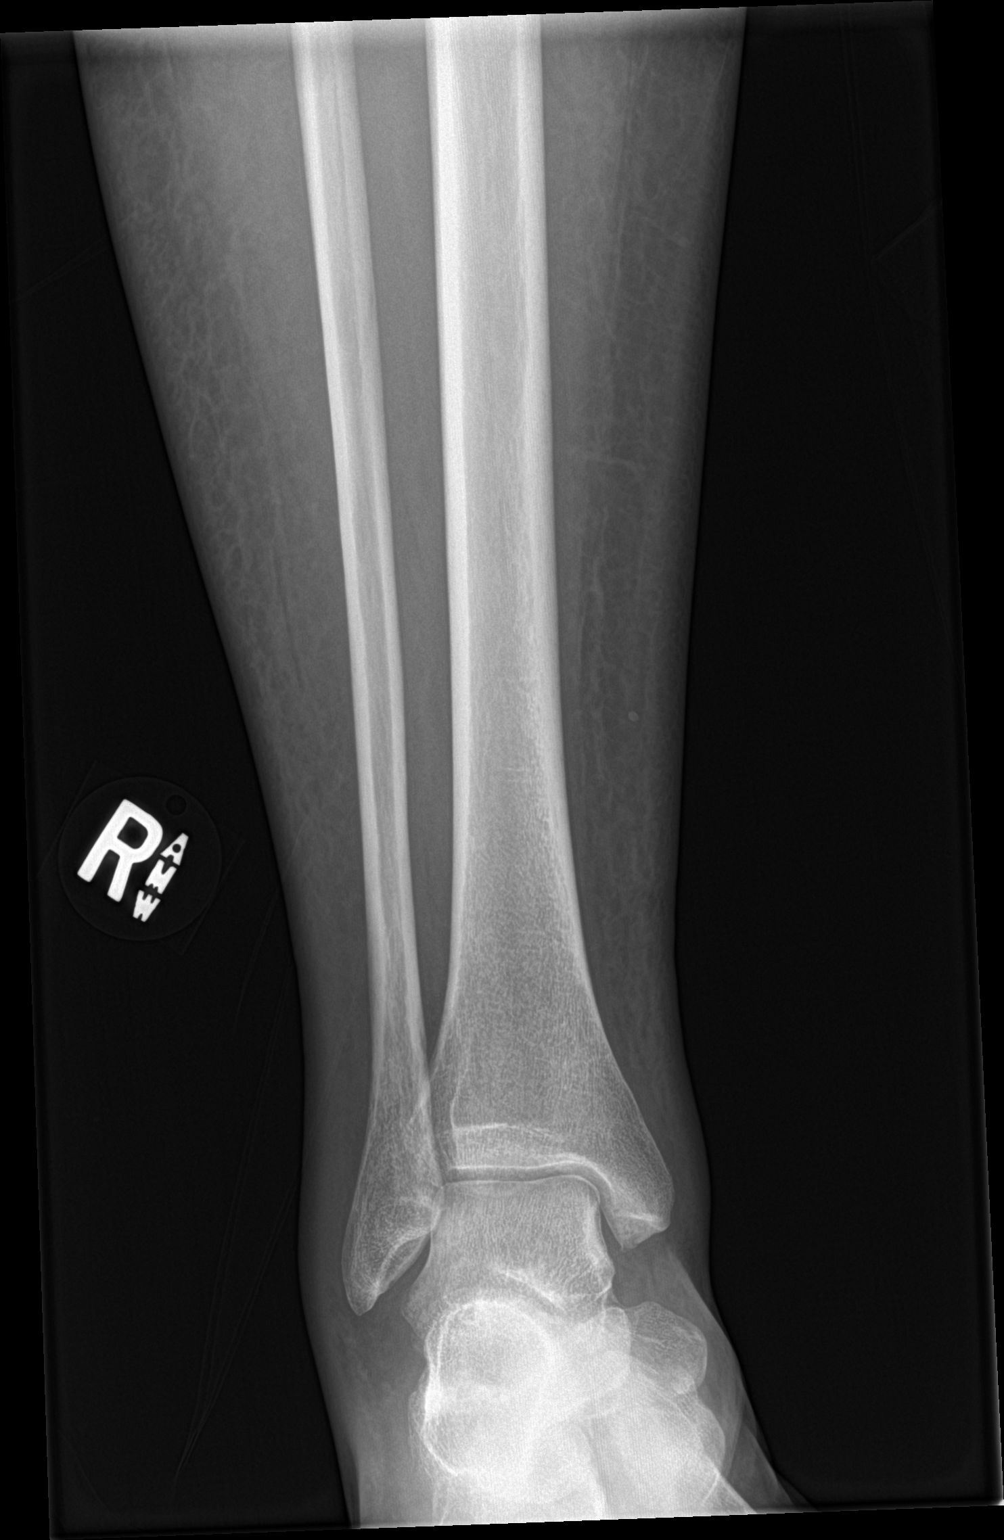

[tibia lat (1 of 2)]
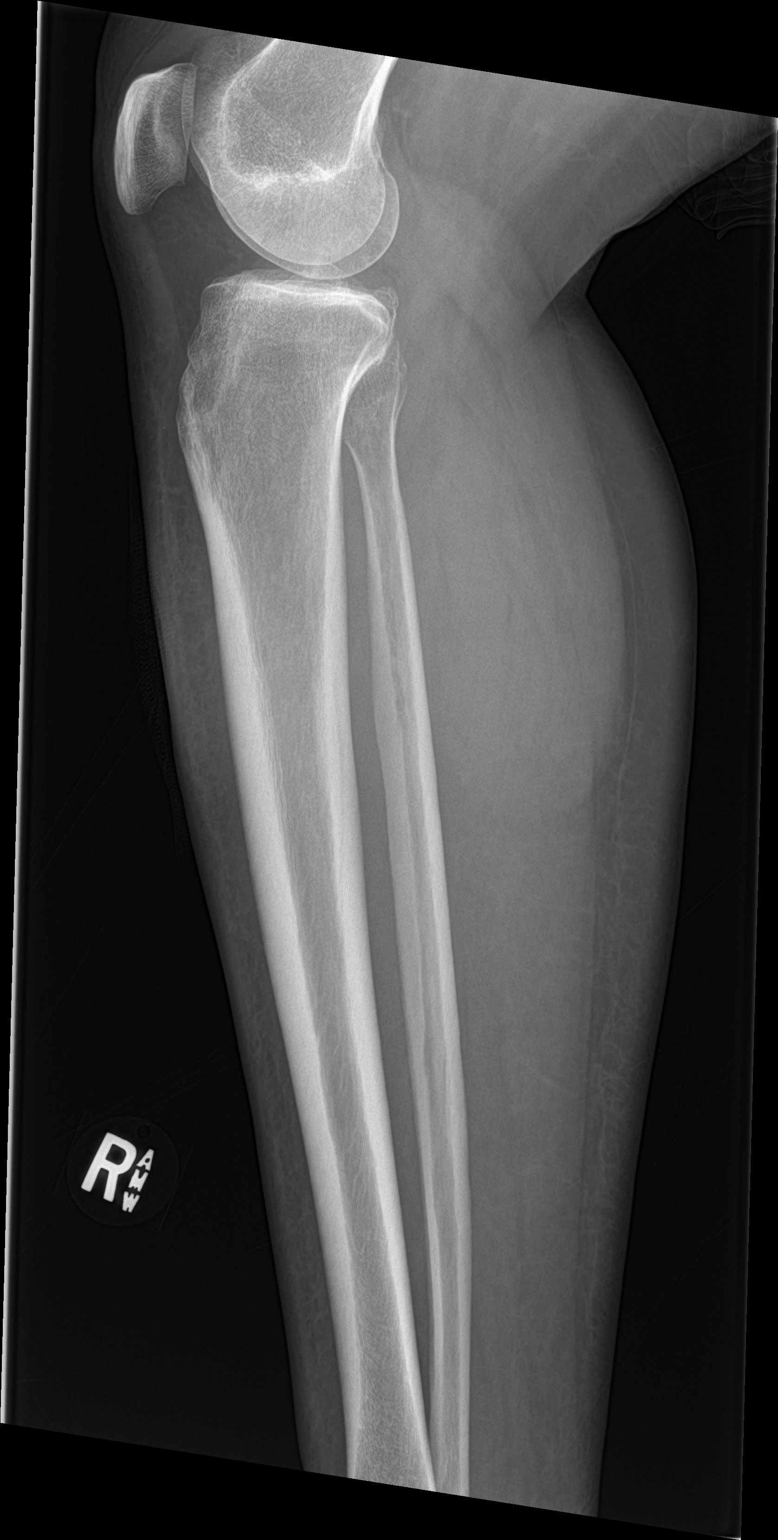

[tibia lat (2 of 2)]
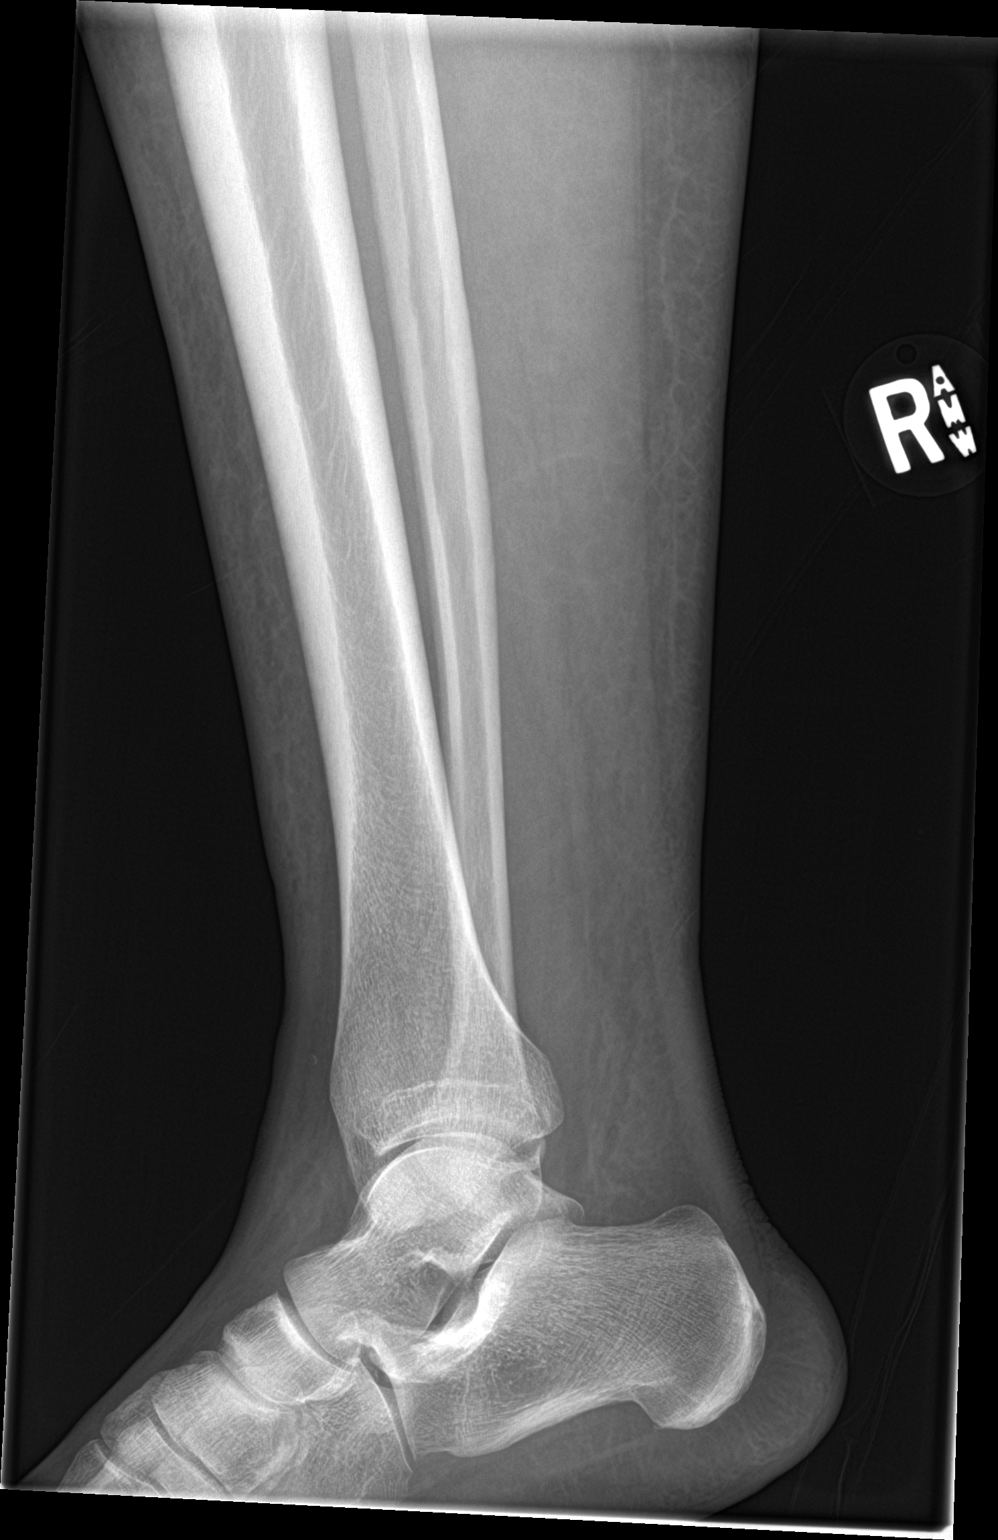

[4 of 4 positions shown; findings below may reference images not displayed]

FINDINGS: There is no evidence of fracture or dislocation. Mild soft tissue
swelling around the ankle is noted.
IMPRESSION: No acute fracture or dislocation.
